# Patient Record
Sex: Female | Born: 1955 | Race: White | Hispanic: No | Marital: Married | Smoking: Never smoker
Health system: Southern US, Community
[De-identification: ages and names within clinical notes are randomized; demographics above are authoritative.]

## PROBLEM LIST (undated history)

## (undated) DIAGNOSIS — R51 Headache: Secondary | ICD-10-CM

## (undated) DIAGNOSIS — Z9010 Acquired absence of unspecified breast and nipple: Secondary | ICD-10-CM

## (undated) DIAGNOSIS — C50919 Malignant neoplasm of unspecified site of unspecified female breast: Secondary | ICD-10-CM

## (undated) DIAGNOSIS — F319 Bipolar disorder, unspecified: Secondary | ICD-10-CM

## (undated) DIAGNOSIS — Q782 Osteopetrosis: Secondary | ICD-10-CM

## (undated) DIAGNOSIS — E7800 Pure hypercholesterolemia, unspecified: Secondary | ICD-10-CM

## (undated) DIAGNOSIS — F419 Anxiety disorder, unspecified: Secondary | ICD-10-CM

## (undated) DIAGNOSIS — E079 Disorder of thyroid, unspecified: Secondary | ICD-10-CM

## (undated) DIAGNOSIS — G4730 Sleep apnea, unspecified: Secondary | ICD-10-CM

## (undated) DIAGNOSIS — T7412XA Child physical abuse, confirmed, initial encounter: Secondary | ICD-10-CM

## (undated) DIAGNOSIS — E119 Type 2 diabetes mellitus without complications: Secondary | ICD-10-CM

## (undated) DIAGNOSIS — K219 Gastro-esophageal reflux disease without esophagitis: Secondary | ICD-10-CM

## (undated) HISTORY — DX: Acquired absence of unspecified breast and nipple: Z90.10

## (undated) HISTORY — DX: Gastro-esophageal reflux disease without esophagitis: K21.9

## (undated) HISTORY — DX: Pure hypercholesterolemia, unspecified: E78.00

## (undated) HISTORY — DX: Disorder of thyroid, unspecified: E07.9

## (undated) HISTORY — DX: Headache: R51

## (undated) HISTORY — PX: BREAST SURGERY: SHX581

## (undated) HISTORY — DX: Osteopetrosis: Q78.2

## (undated) HISTORY — DX: Bipolar disorder, unspecified: F31.9

## (undated) HISTORY — DX: Sleep apnea, unspecified: G47.30

## (undated) HISTORY — DX: Anxiety disorder, unspecified: F41.9

## (undated) HISTORY — DX: Type 2 diabetes mellitus without complications: E11.9

## (undated) HISTORY — DX: Malignant neoplasm of unspecified site of unspecified female breast: C50.919

## (undated) HISTORY — DX: Child physical abuse, confirmed, initial encounter: T74.12XA

---

## 1997-09-29 ENCOUNTER — Emergency Department (HOSPITAL_COMMUNITY): Admission: EM | Admit: 1997-09-29 | Discharge: 1997-09-29 | Payer: Self-pay | Admitting: Emergency Medicine

## 2000-04-02 ENCOUNTER — Encounter (INDEPENDENT_AMBULATORY_CARE_PROVIDER_SITE_OTHER): Payer: Self-pay | Admitting: Specialist

## 2000-04-02 ENCOUNTER — Other Ambulatory Visit: Admission: RE | Admit: 2000-04-02 | Discharge: 2000-04-02 | Payer: Self-pay | Admitting: Internal Medicine

## 2000-09-30 ENCOUNTER — Other Ambulatory Visit: Admission: RE | Admit: 2000-09-30 | Discharge: 2000-09-30 | Payer: Self-pay | Admitting: *Deleted

## 2001-01-20 ENCOUNTER — Ambulatory Visit (HOSPITAL_COMMUNITY): Admission: RE | Admit: 2001-01-20 | Discharge: 2001-01-20 | Payer: Self-pay | Admitting: *Deleted

## 2001-07-28 ENCOUNTER — Emergency Department (HOSPITAL_COMMUNITY): Admission: EM | Admit: 2001-07-28 | Discharge: 2001-07-28 | Payer: Self-pay | Admitting: Emergency Medicine

## 2001-07-30 ENCOUNTER — Encounter (HOSPITAL_COMMUNITY): Admission: RE | Admit: 2001-07-30 | Discharge: 2001-10-28 | Payer: Self-pay | Admitting: Internal Medicine

## 2006-01-15 ENCOUNTER — Ambulatory Visit: Payer: Self-pay | Admitting: Internal Medicine

## 2006-02-18 ENCOUNTER — Encounter (INDEPENDENT_AMBULATORY_CARE_PROVIDER_SITE_OTHER): Payer: Self-pay | Admitting: *Deleted

## 2006-02-18 ENCOUNTER — Ambulatory Visit: Payer: Self-pay | Admitting: Internal Medicine

## 2006-07-14 ENCOUNTER — Ambulatory Visit: Payer: Self-pay | Admitting: Internal Medicine

## 2006-07-14 LAB — CONVERTED CEMR LAB
Albumin: 4.2 g/dL (ref 3.5–5.2)
Alkaline Phosphatase: 59 units/L (ref 39–117)
Ceruloplasmin: 24 mg/dL (ref 21–63)
Ferritin: 100.9 ng/mL (ref 10.0–291.0)
INR: 1 (ref 0.9–2.0)
Iron: 134 ug/dL (ref 42–145)
Prothrombin Time: 12 s (ref 10.0–14.0)
Total Bilirubin: 0.5 mg/dL (ref 0.3–1.2)

## 2006-07-15 ENCOUNTER — Ambulatory Visit: Payer: Self-pay | Admitting: Gastroenterology

## 2006-08-19 ENCOUNTER — Ambulatory Visit: Payer: Self-pay | Admitting: Internal Medicine

## 2008-11-15 IMAGING — US US ABDOMEN COMPLETE
1 series · 13 of 25 positions shown · non-contrast
Comparison: none

ACCESSION NUMBER:  [id number].

 ORDERING PHYSICIAN:  Dr. Kaki Jim.
 READING PHYSICIAN:  Dr. Jido Doaa.
 PROCEDURE:  Multiplanar abdominal ultrasound imaging was performed in the upright, supine, right and left lateral decubitus positions.
 RESULTS:  Abdominal aorta measures 1.8 cm in maximal diameter and appears normal.  The IVC is patent. 
 The pancreas appears normal throughout the head and body without evidence of ductal dilatation, pancreatic masses, or peripancreatic inflammation.  The tail of the pancreas is not adequately imaged. 
 The gallbladder wall thickness measures 2.1 mm. Gallbladder is well distended, thin walled, with no pericholecystic fluid or intraluminal echogenic foci to suggest gallstone disease. 
 The common bile duct measures 5.8 mm in maximal diameter without evidence of intraluminal foci. 
 In the liver, there is a severely increased echodensity with poor definitive of the liver border. The liver appears to be overall enlarged. The liver is without evidence of parenchymal lesion, ductal dilatation or vascular abnormality. 
 The right kidney measures 10.5 cm, the left kidney 10.8 cm. Both kidneys are normal in appearance. 
 Spleen is normal in size, measuring 11 cm without parenchymal lesion.

[Series 1: us abdomen complete · 0.11mm/px · 13 of 54 slices shown]
[im 1/54]
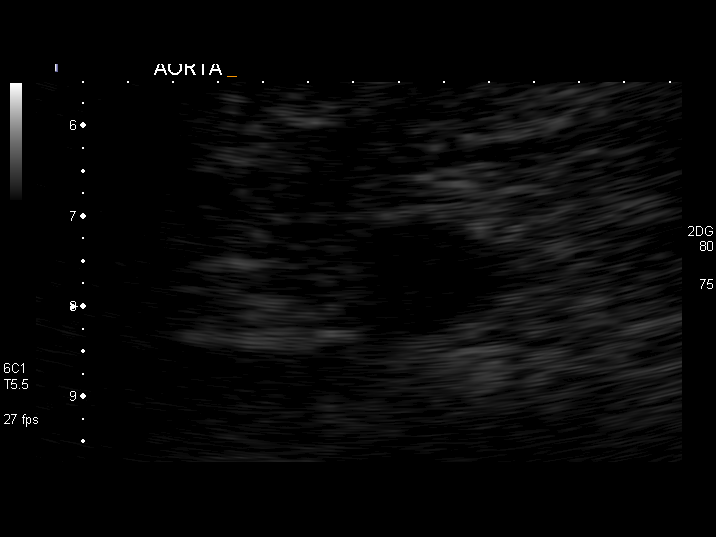
[im 5/54]
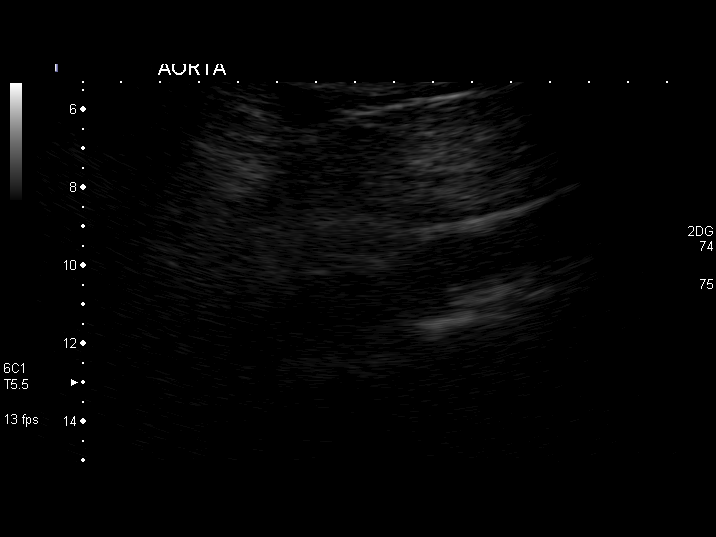
[im 9/54]
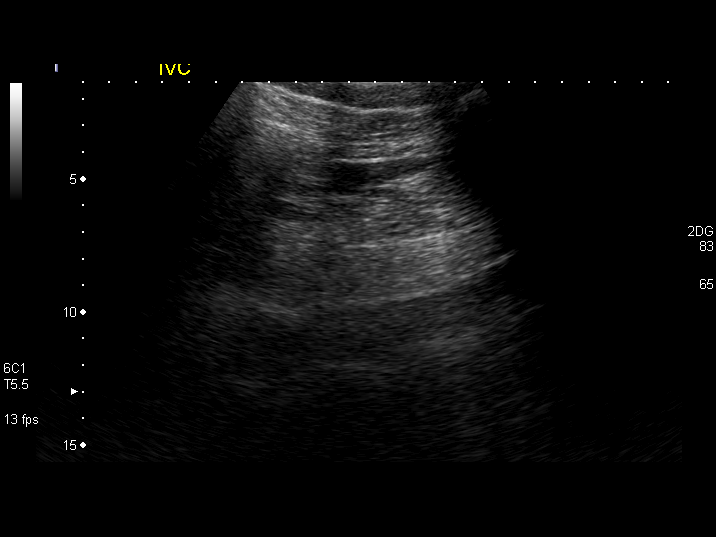
[im 14/54]
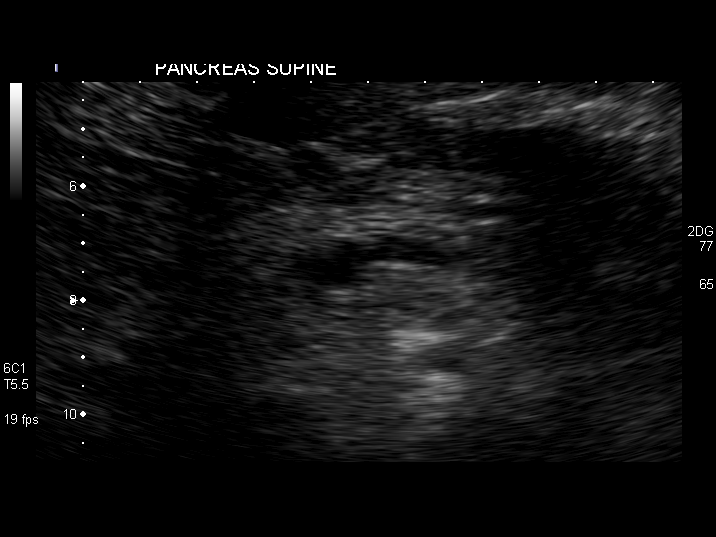
[im 18/54]
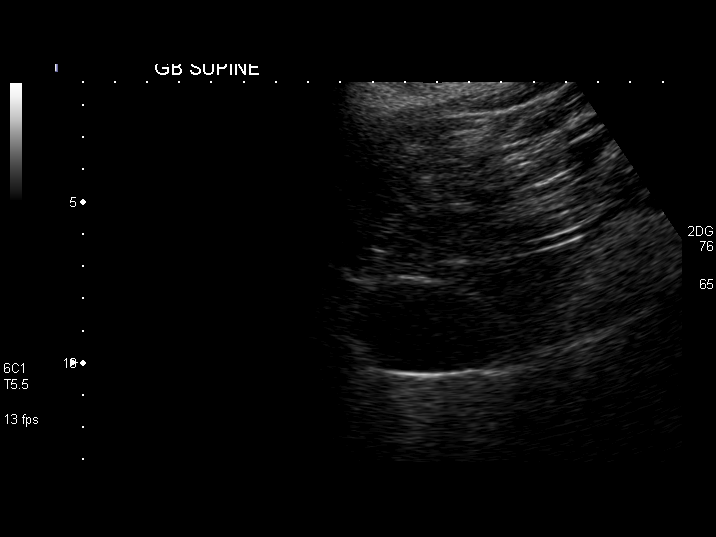
[im 23/54]
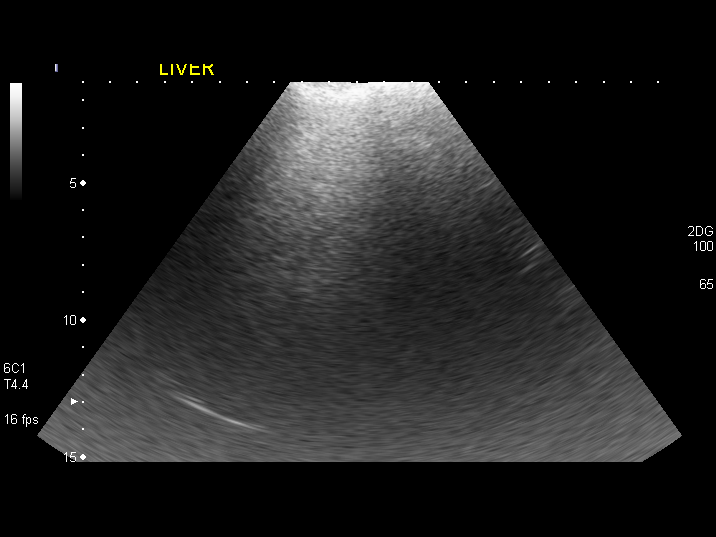
[im 27/54]
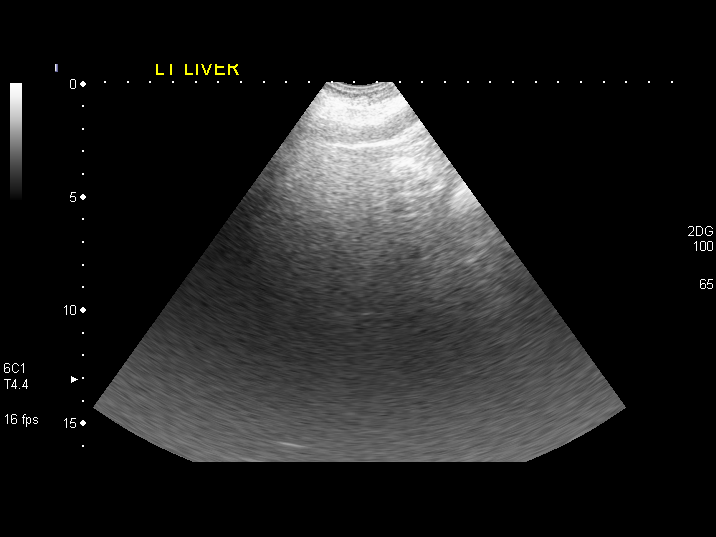
[im 31/54]
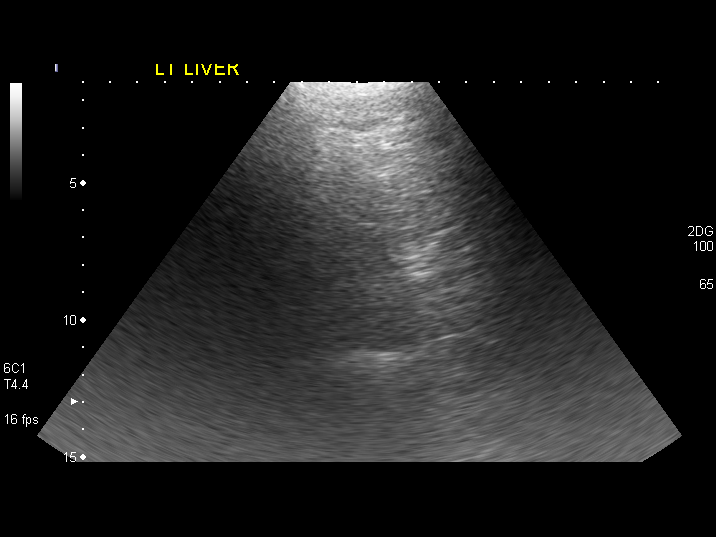
[im 36/54]
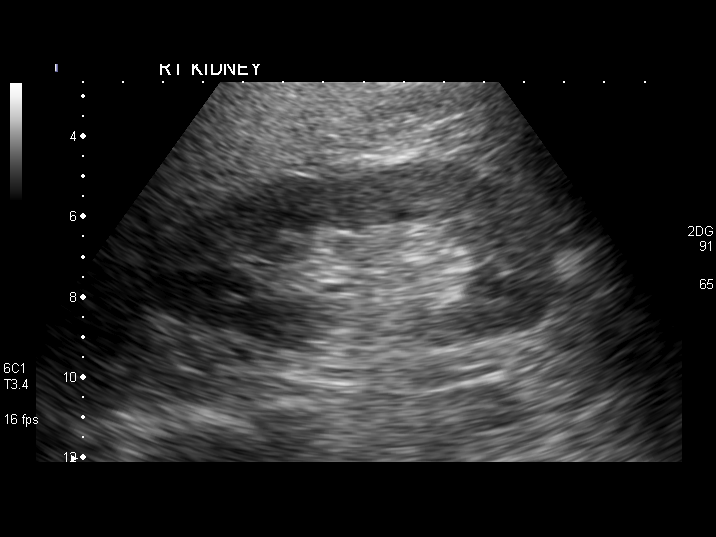
[im 40/54]
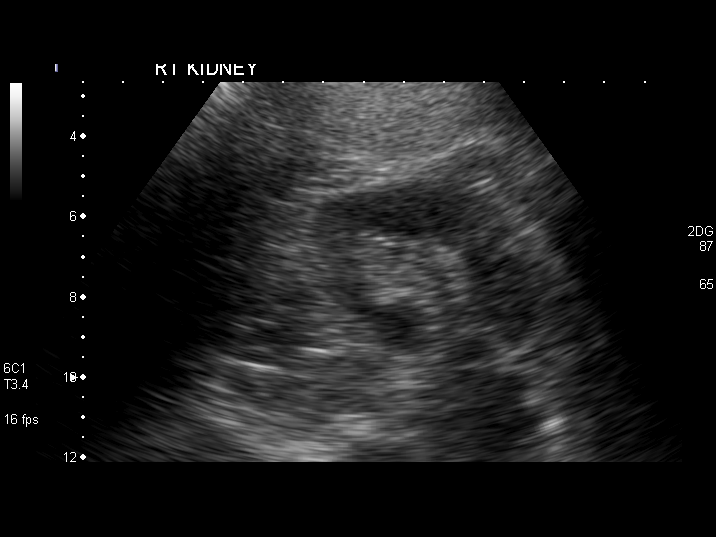
[im 45/54]
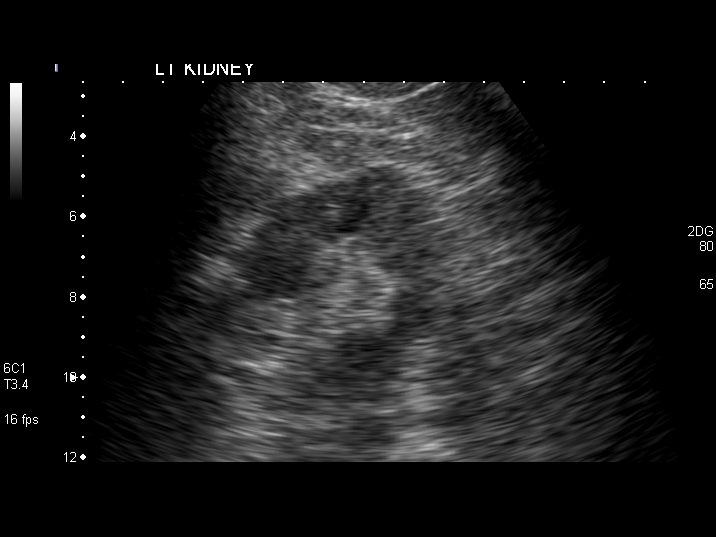
[im 49/54]
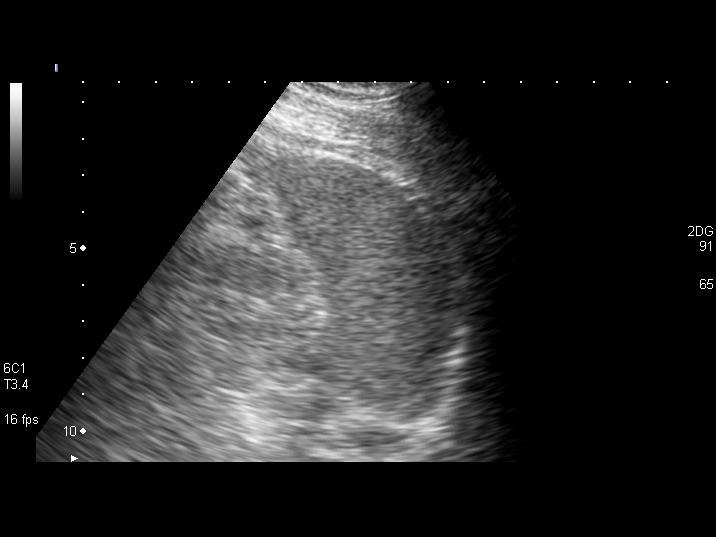
[im 54/54]
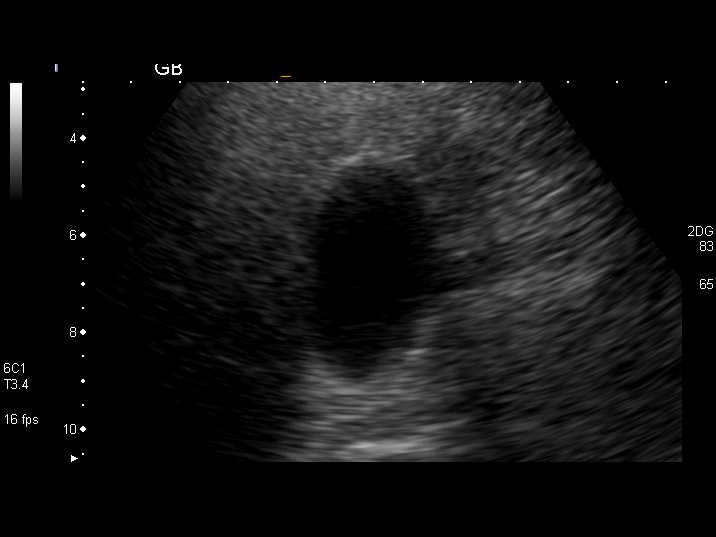

[13 of 25 positions shown; findings below may reference images not displayed]

IMPRESSION: 1.         Markedly increased hepatic echodensity with probable hepatomegaly and poor definition of the hepatic borders.
 2.         Pancreatic tail not imaged.

 <!--[if supportFields]>  IF "" <> "" " IF "TRUE" = "FALSE" "______________________________" "" 
 Xia, Gelacio
   IF "3Calcolm Locklear, Savio" = "3Calcolm Locklear, Savio""""" 

   IF "[id number] Dictated by DICTATOR 1 Xia, Gelacio [id number]  Jumper, Blain [id number]  Pointer, Niyi" = """______________________________
 " "" 
   IF "" = "Xia, Gelacio""
 " "______________________________
 " ______________________________

 " "" <!--[if supportFields]>

## 2010-04-09 ENCOUNTER — Ambulatory Visit (HOSPITAL_COMMUNITY): Payer: Self-pay | Admitting: Licensed Clinical Social Worker

## 2010-04-24 ENCOUNTER — Ambulatory Visit (HOSPITAL_COMMUNITY): Payer: Self-pay | Admitting: Psychiatry

## 2010-05-08 ENCOUNTER — Ambulatory Visit (HOSPITAL_COMMUNITY)
Admission: RE | Admit: 2010-05-08 | Discharge: 2010-05-08 | Payer: Self-pay | Source: Home / Self Care | Attending: Licensed Clinical Social Worker | Admitting: Licensed Clinical Social Worker

## 2010-05-15 ENCOUNTER — Ambulatory Visit (HOSPITAL_COMMUNITY): Admit: 2010-05-15 | Payer: Self-pay | Admitting: Licensed Clinical Social Worker

## 2010-05-28 ENCOUNTER — Ambulatory Visit (HOSPITAL_COMMUNITY): Admit: 2010-05-28 | Payer: Self-pay | Admitting: Psychiatry

## 2010-06-11 ENCOUNTER — Encounter (INDEPENDENT_AMBULATORY_CARE_PROVIDER_SITE_OTHER): Payer: BC Managed Care – PPO | Admitting: Psychiatry

## 2010-06-11 DIAGNOSIS — F319 Bipolar disorder, unspecified: Secondary | ICD-10-CM

## 2010-07-09 ENCOUNTER — Encounter (INDEPENDENT_AMBULATORY_CARE_PROVIDER_SITE_OTHER): Payer: Medicare Other | Admitting: Psychiatry

## 2010-07-09 DIAGNOSIS — F319 Bipolar disorder, unspecified: Secondary | ICD-10-CM

## 2010-07-11 ENCOUNTER — Encounter (HOSPITAL_COMMUNITY): Payer: BC Managed Care – PPO | Admitting: Licensed Clinical Social Worker

## 2010-09-12 ENCOUNTER — Encounter (HOSPITAL_COMMUNITY): Payer: BC Managed Care – PPO | Admitting: Psychiatry

## 2010-09-13 NOTE — Assessment & Plan Note (Signed)
[facility]                         GASTROENTEROLOGY OFFICE NOTE   TULIP, MEHARG                MRN:          [id number]  DATE:07/14/2006                            DOB:          November 14, 1955    REFERRING PHYSICIAN:  Pricilla Holm, M.D.   REASON FOR CONSULTATION:  Elevated liver function tests.   HISTORY:  This is a 55 year old white female with bipolar disorder,  asthma, hypothyroidism, dyslipidemia, gastroesophageal reflux disease,  and colon polyps who is referred through the courteously of Dr. Manson Passey  regarding elevated liver tests.  The patient had liver tests performed  in February 2008.  Her SGOT and SGPT were a few points above normal.  Alkaline phosphatase, bilirubin, albumin and protein were normal.  Serologies for hepatitis A, B and C were negative.  She is now referred.  The patient is not certain if she has had liver test abnormalities in  the past.  She has been on Depakote long term.  She has had a 35 pound  weight gain over the past 6 years.  She denies alcohol use.  She denies  a personal history of hepatitis.  She denies a family history of liver  disease.  She denies a history of jaundice, ascites, ankle edema or  other relevant complaints.  In terms of her reflux disease, she is  maintained on Protonix and doing well.  Upper endoscopy in 2001 revealed  no abnormalities of the esophagus.  Her most recent colonoscopy in  October 2007 revealed several diminutive colon polyps, which were  removed and found to be adenomatous.  Also a smaller hyperplastic  appearing polyp (which she has had in the past) which was not removed.  Reviewed with the patient today.   PAST MEDICAL HISTORY:  As above.   PAST SURGICAL HISTORY:  1. Hemorrhoidectomy.  2. Breast surgery for breast cancer.  3. TRAM procedure.  4. Fractured right ankle.   ALLERGIES:  No known drug allergies.   CURRENT MEDICATIONS:  1. Actonel 35 mg weekly.  2. Aromasin 25 mg daily.  3. Synthroid 88 mcg daily.  4. Gemfibrozil 600 mg daily.  5. Zyrtec 10 mg at night.  6. Advair disk.  7. Singulair.  8. Protonix 40 mg at night.  9. Depakote ER 1000 mg at night.  10.Multivitamin.  11.Calcium.  12.Fish oil.  13.Vitamin E.  14.Acidophilus.  15.Baby aspirin.  16.Albuterol inhaler p.r.n.  17.Preparation H p.r.n.  18.Hemorrhoid wipes p.r.n.   FAMILY HISTORY:  No family history of liver disease.   SOCIAL HISTORY:  The patient is married with 2 daughter, lives with her  husband.  She used to work for [employer], but is currently disabled.  She does  not smoke or use alcohol.   REVIEW OF SYSTEMS:  Per diagnostic evaluation form.   PHYSICAL EXAMINATION:  A well appearing female in no acute distress.  Blood pressure is 118/74, heart rate 68, weight 174.2 pounds.  She is 5  feet 5 inches in height.  HEENT:  Sclerae anicteric.  Conjunctivae are pink.  Oral mucosa is  intact.  There is no adenopathy.  The tongue hue is normal.  LUNGS:  Clear.  HEART:  Regular.  ABDOMEN:  Obese and soft without tenderness, mass or hernia.  No  organomegaly.  Good bowel sounds heard.  No prominent venous pattern on  the abdominal wall.  EXTREMITIES:  Without edema.  NEUROLOGICALLY:  She is intact.  No asterixis.   IMPRESSION:  80. A 55 year old who presents for evaluation of mildly elevated      hepatic transaminases.  Duration unknown.  Statistically, this is      most likely fatty liver disease.  Cannot rule out medication      reaction or other metabolic condition.  No evidence for chronic      viral hepatitis or alcohol induced changes.  2. Gastroesophageal reflux disease.  Asymptomatic on Protonix.  3. History of colon polyps.  Due for a follow up in October 2012.   RECOMMENDATIONS:  1. Expand laboratory profile to evaluate for chronic non-viral causes      for elevated liver function tests.  2. Ultrasound of the liver.  3. Office follow up in 1 month to  review the above.  Otherwise, resume      general medical care with Dr. Manson Passey.    Wilhemina Bonito. Marina Goodell, MD  Electronically Signed   JNP/MedQ  DD: 07/15/2006  DT: 07/16/2006  Job #: [id number]   cc:   Pricilla Holm, M.D.

## 2010-09-13 NOTE — Procedures (Signed)
[facility]  Patient:    Natalie Frost, Natalie Frost Visit Number: [id number] MRN: [id number]          Service Type: END Location: ENDO Attending Physician:  Sabino Gasser Proc. Date: 01/20/01 Admit Date:  01/20/2001                             Procedure Report  PROCEDURE:  Colonoscopy.  INDICATIONS:  Rectal bleeding.  ANESTHESIA:  Demerol 25, Versed 2 additional mg.  DESCRIPTION OF PROCEDURE:  With the patient mildly sedated in the left lateral decubitus position, the Olympus videoscopic colonoscope was inserted in the rectum and passed under direct vision to the cecum, identified by the ileocecal valve and appendiceal orifice, both of which were photographed. From this point, the colonoscope was slowly withdrawn taking circumferential views of the entire colonic mucosa, stopping only in the rectum which appeared normal on direct and  retroflexed view.  The endoscope was then straightened and withdrawn.  The patients vital signs and pulse oximeter remained stable. The patient tolerated the procedure well without apparent complications.  FINDINGS:  Essentially negative colonoscopic examination.  PLAN:  Have patient follow up with me as an outpatient. Attending Physician:  Sabino Gasser DD:  01/20/01 TD:  01/20/01 Job: [id number]

## 2010-09-13 NOTE — Procedures (Signed)
[facility]  Patient:    SEJLA, MARZANO Visit Number: [id number] MRN: [id number]          Service Type: END Location: ENDO Attending Physician:  Sabino Gasser Dictated by:   Sabino Gasser, M.D. Admit Date:  01/20/2001                             Procedure Report  PROCEDURE:  Upper endoscopy.  INDICATIONS:  GERD.  ANESTHESIA:  Demerol 50 mg, Versed 5 mg.  DESCRIPTION OF PROCEDURE:  With the patient mildly sedated, in the left lateral decubitus position, the Olympus videoscopic endoscope was inserted into the mouth, passed under direct vision through the esophagus which appeared normal into the stomach.  Fundus, body, antrum, duodenal bulb, second portion of the duodenum were all well visualized and appeared normal.  From this point the endoscope was slowly withdrawn taking circumferential views of the entire duodenal mucosa until the endoscope was pulled back into the stomach and placed in retroflexion to view the stomach from below.  The endoscope was then straightened and withdrawn, taking circumferential views of the remaining gastric and esophageal mucosa which otherwise appeared normal. The patients vital signs and pulse oximetry remained stable.  The patient tolerated the procedure well without apparent complications.  FINDINGS:  Essentially normal examination.  PLAN:  Proceed to colonoscopy as planned. Dictated by:   Sabino Gasser, M.D. Attending Physician:  Sabino Gasser DD:  01/20/01 TD:  01/20/01 Job: [id number]

## 2010-09-13 NOTE — Assessment & Plan Note (Signed)
[facility]                         GASTROENTEROLOGY OFFICE NOTE   Natalie Frost, Natalie Frost                MRN:          [id number]  DATE:08/19/2006                            DOB:          08-22-1955    HISTORY:  The patient presents today for followup of her mildly elevated  hepatic transaminases.  She is a 55 year old with bipolar disorder,  asthma, hypothyroidism, dyslipidemia, gastroesophageal reflux disease  and colon polyps, who was evaluated on July 14, 2006, regarding  elevated transaminases.  See that dictation for details.  The most  significant aspect of her history is that she has gained 35 pounds over  the past six years.  Her husband states it has really been over the past  2-1/2 years.  In any event, viral studies were negative.  She does not  use alcohol.   Other studies including alpha-1 antitrypsin ceruloplasmin,  antimitochondrial antibody, antinuclear antibody, iron studies,  prothrombin time, proteins and globulin level were all normal.  She did  have a weakly positive smooth muscle antibody at 27.   An abdominal ultrasound revealed markedly increased hepatic endo-density  of the liver, without focal lesions.  Her liver was borderline enlarged.   She comes in today to review these findings.  It is difficult to say  without documentation, but it appears that after being placed on the  Depakote initially, there were no problems with her liver tests for  years, but rather issues more recently.  Her dosage of the drug  apparently has been stable.   PHYSICAL EXAMINATION:  GENERAL:  A well-appearing female, in no acute  distress.  VITAL SIGNS:  Blood pressure 104/74, heart rate 84, weight 171.2 pounds.  ABDOMEN:  Soft, without tenderness, mass or hernia.  There is no  palpable liver or spleen.   IMPRESSION:  Mildly elevated hepatic transaminases, most likely due to  fatty liver disease.  Cannot differentiate between benign  fatty liver  disease and non-alcoholic steatohepatitis.  Certainly could have drug  reaction related to Depakote, though this seems less likely, based on  the history.   One way to differentiate might be a liver biopsy, though this is  invasive with inherent risks.  Alternatively, she could have her drug  held for six months and her liver tests monitored.  Finally, and what I  recommended, is that she lose 10-15 pounds over the next six months and  follow up in this office for repeat liver tests and evaluation at that  time.  If questions remain or clinical  concerns develop, then she may indeed require a liver biopsy.  In the  interim, she will return to the care of Dr. Adan Sis and Dr.  Nolen Mu.     Wilhemina Bonito. Marina Goodell, MD  Electronically Signed    JNP/MedQ  DD: 08/19/2006  DT: 08/19/2006  Job #: [id number]   cc:   Pricilla Holm, M.D.  Andee Poles, M.D.

## 2010-10-15 ENCOUNTER — Encounter (INDEPENDENT_AMBULATORY_CARE_PROVIDER_SITE_OTHER): Payer: BC Managed Care – PPO | Admitting: Psychiatry

## 2010-10-15 DIAGNOSIS — F319 Bipolar disorder, unspecified: Secondary | ICD-10-CM

## 2010-10-15 DIAGNOSIS — F411 Generalized anxiety disorder: Secondary | ICD-10-CM

## 2010-10-17 ENCOUNTER — Ambulatory Visit
Admission: RE | Admit: 2010-10-17 | Discharge: 2010-10-17 | Disposition: A | Discharge status: Home / Self Care | Payer: BC Managed Care – PPO | Source: Ambulatory Visit | Attending: Orthopedic Surgery | Admitting: Orthopedic Surgery

## 2010-10-17 ENCOUNTER — Other Ambulatory Visit: Payer: Self-pay | Admitting: Orthopedic Surgery

## 2010-10-17 DIAGNOSIS — M79642 Pain in left hand: Secondary | ICD-10-CM

## 2010-10-17 DIAGNOSIS — M7989 Other specified soft tissue disorders: Secondary | ICD-10-CM

## 2010-10-29 ENCOUNTER — Other Ambulatory Visit: Payer: Self-pay | Admitting: Orthopedic Surgery

## 2010-10-29 DIAGNOSIS — T148XXA Other injury of unspecified body region, initial encounter: Secondary | ICD-10-CM

## 2010-10-31 ENCOUNTER — Other Ambulatory Visit: Payer: Self-pay | Admitting: Orthopedic Surgery

## 2010-10-31 ENCOUNTER — Ambulatory Visit
Admission: RE | Admit: 2010-10-31 | Discharge: 2010-10-31 | Disposition: A | Discharge status: Home / Self Care | Payer: BC Managed Care – PPO | Source: Ambulatory Visit | Attending: Orthopedic Surgery | Admitting: Orthopedic Surgery

## 2010-10-31 DIAGNOSIS — T148XXA Other injury of unspecified body region, initial encounter: Secondary | ICD-10-CM

## 2010-11-28 ENCOUNTER — Encounter (INDEPENDENT_AMBULATORY_CARE_PROVIDER_SITE_OTHER): Payer: BC Managed Care – PPO | Admitting: Psychiatry

## 2011-02-04 ENCOUNTER — Encounter (HOSPITAL_COMMUNITY): Payer: Self-pay | Admitting: Psychology

## 2011-02-18 ENCOUNTER — Encounter: Payer: Self-pay | Admitting: Internal Medicine

## 2011-03-04 ENCOUNTER — Encounter (HOSPITAL_COMMUNITY): Payer: Medicare Other | Admitting: Psychiatry

## 2011-03-25 ENCOUNTER — Encounter (HOSPITAL_COMMUNITY): Payer: Self-pay | Admitting: Psychiatry

## 2011-03-27 ENCOUNTER — Ambulatory Visit (INDEPENDENT_AMBULATORY_CARE_PROVIDER_SITE_OTHER): Payer: Medicare Other | Admitting: Psychiatry

## 2011-03-27 ENCOUNTER — Encounter (HOSPITAL_COMMUNITY): Payer: Self-pay | Admitting: Psychiatry

## 2011-03-27 VITALS — BP 122/82 | Ht 65.0 in | Wt 161.0 lb

## 2011-03-27 DIAGNOSIS — F411 Generalized anxiety disorder: Secondary | ICD-10-CM

## 2011-03-27 DIAGNOSIS — F319 Bipolar disorder, unspecified: Secondary | ICD-10-CM

## 2011-03-27 NOTE — Progress Notes (Signed)
   [facility] Follow-up Outpatient Visit  Natalie Frost 04-21-1956   Subjective: Patient is a 55 year old female who has been followed by [facility] since November of 2011. The patient is currently diagnosed with bipolar affective disorder along with generalized anxiety disorder. She's been doing very well on Lamictal and Zoloft. At her last appointment which was in August patient was complaining about trouble with sleep. At that time I started her on trazodone. On seeing her back today for the first time. Patient is only been taking one half of a 50 mg trazodone. She states that it helps a little bit but it still takes a long time to fall asleep. The patient has lost 17 pounds since initiating treatment here. A lot of it has to do with the fact of getting her off the Depakote and onto the Lamictal. She states that her physician wants her to get down to 140 pounds. The patient has been much more outgoing. She has joined a new church. She recently hosted a birthday party for her mother-in-law 4/85 birthday. There're approximately 60 people who attended this. I reminded her about last Christmas and how she was overwhelmed with hosting 18 people. The patient states that sometimes she still will get aggravated with her husband or her 2 grandchildren. However this appears to be appropriate behavior. The patient denies any mood swings.  Filed Vitals:   03/27/11 1331  BP: 122/82    Mental Status Examination  Appearance: Casually dressed Alert: Yes Attention: good  Cooperative: Yes Eye Contact: Good Speech: Regular rate rhythm and volume Psychomotor Activity: Normal Memory/Concentration: Intact Oriented: person, place, time/date and situation Mood: Euthymic Affect: Full Range Thought Processes and Associations: Logical Fund of Knowledge: Fair Thought Content: No suicidal or homicidal Insight: Fair Judgement: Fair  Diagnosis: Bipolar disorder, generalized  anxiety disorder  Treatment Plan: Patient is advised that she could take a little bit more trazodone if she needs to. A prescription says that she can take 1-250 mg tablets at bedtime. She is currently only taking one half. We will continue her Lamictal and Zoloft. I will see her back in 3 months.  Jamse Mead, MD

## 2011-05-17 ENCOUNTER — Other Ambulatory Visit (HOSPITAL_COMMUNITY): Payer: Self-pay | Admitting: Psychiatry

## 2011-05-17 DIAGNOSIS — F319 Bipolar disorder, unspecified: Secondary | ICD-10-CM

## 2011-05-19 ENCOUNTER — Other Ambulatory Visit (HOSPITAL_COMMUNITY): Payer: Self-pay | Admitting: Psychiatry

## 2011-06-26 ENCOUNTER — Ambulatory Visit (HOSPITAL_COMMUNITY): Payer: Medicare Other | Admitting: Psychiatry

## 2011-07-18 ENCOUNTER — Encounter (HOSPITAL_COMMUNITY): Payer: Self-pay | Admitting: Psychiatry

## 2011-07-18 ENCOUNTER — Ambulatory Visit (INDEPENDENT_AMBULATORY_CARE_PROVIDER_SITE_OTHER): Payer: Medicare Other | Admitting: Psychiatry

## 2011-07-18 VITALS — BP 142/85 | Ht 65.0 in | Wt 157.0 lb

## 2011-07-18 DIAGNOSIS — F319 Bipolar disorder, unspecified: Secondary | ICD-10-CM

## 2011-07-18 DIAGNOSIS — F314 Bipolar disorder, current episode depressed, severe, without psychotic features: Secondary | ICD-10-CM | POA: Insufficient documentation

## 2011-07-18 DIAGNOSIS — F411 Generalized anxiety disorder: Secondary | ICD-10-CM | POA: Insufficient documentation

## 2011-07-18 NOTE — Progress Notes (Signed)
   [facility] Behavioral Health Follow-up Outpatient Visit  Natalie Frost Jul 25, 1955   Subjective: Patient is a 56 year old female who has been followed by [facility] since November of 2011. The patient is currently diagnosed with bipolar affective disorder along with generalized anxiety disorder. She's been doing very well on Lamictal and Zoloft. She also takes trazodone for insomnia. The patient reports today that she's been doing well. She is down another 4 pounds. She is very proud of her weight loss. She does use a CPAP machine to sleep, and says that can interfere with her sleep at times. She complains about some weird dreams. The patient had the flu recently. She states that this caused a drop in blood sugar and she did pass out at one point. Her husband revived her with grape juice. Her primary care physician put her on albuterol nebs to help with breathing. The patient endorses stable mood. She did have a steroid dose pack recently, which concerns me of the possibility fueling her into mania. She is a little giddy today, but nothing inappropriate.  Filed Vitals:   07/18/11 1038  BP: 142/85    Mental Status Examination  Appearance: Casually dressed Alert: Yes Attention: good  Cooperative: Yes Eye Contact: Good Speech: Regular rate rhythm and volume Psychomotor Activity: Normal Memory/Concentration: Intact Oriented: person, place, time/date and situation Mood: Euthymic Affect: Full Range Thought Processes and Associations: Logical Fund of Knowledge: Fair Thought Content: No suicidal or homicidal Insight: Fair Judgement: Fair  Diagnosis: Bipolar disorder, generalized anxiety disorder  Treatment Plan: We will not make any changes today. I will see her back in 3 months.  Jamse Mead, MD

## 2011-10-23 ENCOUNTER — Encounter (HOSPITAL_COMMUNITY): Payer: Self-pay | Admitting: Psychiatry

## 2011-10-23 ENCOUNTER — Ambulatory Visit (INDEPENDENT_AMBULATORY_CARE_PROVIDER_SITE_OTHER): Payer: Medicare Other | Admitting: Psychiatry

## 2011-10-23 VITALS — BP 132/82 | Ht 65.0 in | Wt 161.0 lb

## 2011-10-23 DIAGNOSIS — F411 Generalized anxiety disorder: Secondary | ICD-10-CM

## 2011-10-23 DIAGNOSIS — F319 Bipolar disorder, unspecified: Secondary | ICD-10-CM

## 2011-10-23 MED ORDER — TRAZODONE HCL 50 MG PO TABS: 100.0000 mg | ORAL_TABLET | Freq: Every day | ORAL | Status: DC

## 2011-10-23 MED ORDER — LAMOTRIGINE 100 MG PO TABS: 100.0000 mg | ORAL_TABLET | Freq: Every day | ORAL | Status: DC

## 2011-10-23 NOTE — Progress Notes (Signed)
   [facility] Behavioral Health Follow-up Outpatient Visit  Chrishelle Milissa Fesperman 06-13-1955   Subjective: Patient is a 56 year old female who has been followed by [facility] since November of 2011. The patient is currently diagnosed with bipolar affective disorder along with generalized anxiety disorder. She's been doing very well on Lamictal and Zoloft. She also takes trazodone for insomnia. I did not make any changes at her last appointment. Today she presents in a good mood. She is up 4 pounds today. She just returned from a weeklong trip to [facility]. Her husband is remodeling some in her house. She shows me pictures, and it is a beautiful estate. She has an outdoor pool. Her 4 grandchildren visit a lot. The patient endorses good mood. She has no issues with appetite. She is having a trouble with nightmares at night. She's taking the trazodone 50 mg and it does help with sleep, but the nightmares continue. She is concerned about this. She denies any manic episodes. She feels that her anxiety is under control.  Filed Vitals:   10/23/11 1043  BP: 132/82    Mental Status Examination  Appearance: Casually dressed Alert: Yes Attention: good  Cooperative: Yes Eye Contact: Good Speech: Regular rate rhythm and volume Psychomotor Activity: Normal Memory/Concentration: Intact Oriented: person, place, time/date and situation Mood: Euthymic Affect: Full Range Thought Processes and Associations: Logical Fund of Knowledge: Fair Thought Content: No suicidal or homicidal Insight: Fair Judgement: Fair  Diagnosis: Bipolar disorder, generalized anxiety disorder  Treatment Plan: I will increase trazodone to 100 mg at bedtime. I will continue the Lamictal, and Zoloft. I will see the patient back in 2 months.   Jamse Mead, MD

## 2011-12-03 ENCOUNTER — Other Ambulatory Visit (HOSPITAL_COMMUNITY): Payer: Self-pay | Admitting: Psychiatry

## 2011-12-25 ENCOUNTER — Ambulatory Visit (INDEPENDENT_AMBULATORY_CARE_PROVIDER_SITE_OTHER): Payer: Medicare Other | Admitting: Psychiatry

## 2011-12-25 ENCOUNTER — Encounter (HOSPITAL_COMMUNITY): Payer: Self-pay | Admitting: Psychiatry

## 2011-12-25 VITALS — BP 122/82 | Ht 65.0 in | Wt 163.0 lb

## 2011-12-25 DIAGNOSIS — F319 Bipolar disorder, unspecified: Secondary | ICD-10-CM

## 2011-12-25 DIAGNOSIS — F411 Generalized anxiety disorder: Secondary | ICD-10-CM

## 2011-12-25 NOTE — Progress Notes (Signed)
   [facility]al [facility] Follow-up Outpatient Visit  Natalie Frost 1955-09-02   Subjective: Patient is a 56 year old female who has been followed by [facility] since November of 2011. The patient is currently diagnosed with bipolar affective disorder along with generalized anxiety disorder. She's been doing very well on Lamictal and Zoloft. She also takes trazodone for insomnia. At her last appointment, I increased her trazodone to 100 mg at bedtime because of frequent nightmares. She presents today. She endorses occasional poor sleep still. She does wear CPAP at night. She is a diabetic and must proper feet up. She also has a sore hip so it's hard to turn. This environment makes it difficult to sleep well. She is concerned about her daughter. Her daughter's husband has been seeing other women. The patient is afraid of how it will impact her grandchildren when their father leaves. She feels as though she is doing well and knows that she needs be supportive for the grandchildren. She and her husband are doing well. She endorses good sleep and appetite. She is up 2 pounds today, but still maintains her healthy weight loss over the past few years. She is pleased with how well her medication is working.  Filed Vitals:   12/25/11 1101  BP: 122/82    Mental Status Examination  Appearance: Casually dressed Alert: Yes Attention: good  Cooperative: Yes Eye Contact: Good Speech: Regular rate rhythm and volume Psychomotor Activity: Normal Memory/Concentration: Intact Oriented: person, place, time/date and situation Mood: Euthymic Affect: Full Range Thought Processes and Associations: Logical Fund of Knowledge: Fair Thought Content: No suicidal or homicidal Insight: Fair Judgement: Fair  Diagnosis: Bipolar disorder, generalized anxiety disorder  Treatment Plan: I will not make any changes today. I will continue the Lamictal, Zoloft, and trazodone. I will see the  patient back in 3 months.   Jamse Mead, MD

## 2011-12-29 ENCOUNTER — Other Ambulatory Visit (HOSPITAL_COMMUNITY): Payer: Self-pay | Admitting: Psychiatry

## 2012-01-23 ENCOUNTER — Encounter: Payer: Self-pay | Admitting: Internal Medicine

## 2012-01-27 ENCOUNTER — Other Ambulatory Visit (HOSPITAL_COMMUNITY): Payer: Self-pay | Admitting: Psychiatry

## 2012-03-30 ENCOUNTER — Ambulatory Visit (HOSPITAL_COMMUNITY): Payer: Self-pay | Admitting: Psychiatry

## 2012-03-31 ENCOUNTER — Other Ambulatory Visit (HOSPITAL_COMMUNITY): Payer: Self-pay | Admitting: Psychiatry

## 2012-04-01 NOTE — Telephone Encounter (Signed)
Needs appointment

## 2012-04-09 ENCOUNTER — Other Ambulatory Visit (HOSPITAL_COMMUNITY): Payer: Self-pay | Admitting: Psychiatry

## 2012-05-13 ENCOUNTER — Ambulatory Visit (INDEPENDENT_AMBULATORY_CARE_PROVIDER_SITE_OTHER): Payer: BC Managed Care – PPO | Admitting: Psychiatry

## 2012-05-13 ENCOUNTER — Encounter (HOSPITAL_COMMUNITY): Payer: Self-pay | Admitting: Psychiatry

## 2012-05-13 VITALS — BP 120/78 | Ht 65.0 in | Wt 164.0 lb

## 2012-05-13 DIAGNOSIS — F411 Generalized anxiety disorder: Secondary | ICD-10-CM

## 2012-05-13 DIAGNOSIS — F319 Bipolar disorder, unspecified: Secondary | ICD-10-CM
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
## 2012-05-20 ENCOUNTER — Other Ambulatory Visit (HOSPITAL_COMMUNITY): Payer: Self-pay | Admitting: Psychiatry

## 2012-06-29 ENCOUNTER — Other Ambulatory Visit (HOSPITAL_COMMUNITY): Payer: Self-pay | Admitting: Psychiatry

## 2012-08-03 ENCOUNTER — Other Ambulatory Visit (HOSPITAL_COMMUNITY): Payer: Self-pay | Admitting: Psychiatry

## 2012-08-03 MED ORDER — SERTRALINE HCL 25 MG PO TABS: ORAL_TABLET | ORAL | Status: DC

## 2012-08-11 ENCOUNTER — Ambulatory Visit (HOSPITAL_COMMUNITY): Payer: Self-pay | Admitting: Psychiatry

## 2012-09-15 ENCOUNTER — Ambulatory Visit (INDEPENDENT_AMBULATORY_CARE_PROVIDER_SITE_OTHER): Payer: BC Managed Care – PPO | Admitting: Psychiatry

## 2012-09-15 ENCOUNTER — Encounter (HOSPITAL_COMMUNITY): Payer: Self-pay | Admitting: Psychiatry

## 2012-09-15 VITALS — BP 144/90 | Ht 65.0 in | Wt 166.0 lb

## 2012-09-15 DIAGNOSIS — F411 Generalized anxiety disorder: Secondary | ICD-10-CM

## 2012-09-15 DIAGNOSIS — F319 Bipolar disorder, unspecified: Secondary | ICD-10-CM

## 2012-09-15 MED ORDER — LAMOTRIGINE 100 MG PO TABS: ORAL_TABLET | ORAL | Status: DC
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
## 2012-10-12 ENCOUNTER — Telehealth (HOSPITAL_COMMUNITY): Payer: Self-pay | Admitting: Psychiatry

## 2012-10-12 MED ORDER — TRAZODONE HCL 50 MG PO TABS: 100.0000 mg | ORAL_TABLET | Freq: Every day | ORAL | Status: DC

## 2012-10-12 NOTE — Telephone Encounter (Signed)
Called patient. Left message informing patient of refill of trazodone and her appointment on 10/21/2012.

## 2012-10-21 ENCOUNTER — Encounter (HOSPITAL_COMMUNITY): Payer: Self-pay | Admitting: Psychiatry

## 2012-10-21 ENCOUNTER — Ambulatory Visit (INDEPENDENT_AMBULATORY_CARE_PROVIDER_SITE_OTHER): Payer: BC Managed Care – PPO | Admitting: Psychiatry

## 2012-10-21 VITALS — BP 126/82 | Ht 65.0 in | Wt 168.0 lb

## 2012-10-21 DIAGNOSIS — F319 Bipolar disorder, unspecified: Secondary | ICD-10-CM

## 2012-10-21 DIAGNOSIS — F411 Generalized anxiety disorder: Secondary | ICD-10-CM
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
## 2012-11-10 ENCOUNTER — Other Ambulatory Visit (HOSPITAL_COMMUNITY): Payer: Self-pay | Admitting: Psychiatry

## 2012-11-10 ENCOUNTER — Other Ambulatory Visit (HOSPITAL_COMMUNITY): Payer: Self-pay | Admitting: *Deleted

## 2012-11-10 DIAGNOSIS — F411 Generalized anxiety disorder: Secondary | ICD-10-CM

## 2012-11-10 MED ORDER — SERTRALINE HCL 25 MG PO TABS: ORAL_TABLET | ORAL | Status: DC

## 2013-01-17 ENCOUNTER — Other Ambulatory Visit (HOSPITAL_COMMUNITY): Payer: Self-pay | Admitting: Psychiatry

## 2013-01-19 ENCOUNTER — Ambulatory Visit (HOSPITAL_COMMUNITY): Payer: Self-pay | Admitting: Psychiatry

## 2013-01-20 ENCOUNTER — Ambulatory Visit (INDEPENDENT_AMBULATORY_CARE_PROVIDER_SITE_OTHER): Payer: BC Managed Care – PPO | Admitting: Psychiatry

## 2013-01-20 ENCOUNTER — Encounter (HOSPITAL_COMMUNITY): Payer: Self-pay | Admitting: Psychiatry

## 2013-01-20 VITALS — BP 124/84 | Ht 65.0 in | Wt 164.0 lb

## 2013-01-20 DIAGNOSIS — F319 Bipolar disorder, unspecified: Secondary | ICD-10-CM

## 2013-01-20 DIAGNOSIS — F411 Generalized anxiety disorder: Secondary | ICD-10-CM
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
## 2013-02-17 ENCOUNTER — Other Ambulatory Visit (HOSPITAL_COMMUNITY): Payer: Self-pay | Admitting: Psychiatry

## 2013-02-17 DIAGNOSIS — F411 Generalized anxiety disorder: Secondary | ICD-10-CM

## 2013-02-17 IMAGING — CR DG HAND 2V*L*
1 series · 1 of 1 positions shown · non-contrast
Comparison: None.

CLINICAL DATA: Fell 1 day ago with pain in the fourth and fifth
fingers

LEFT HAND - 2 VIEW

[view not recorded]
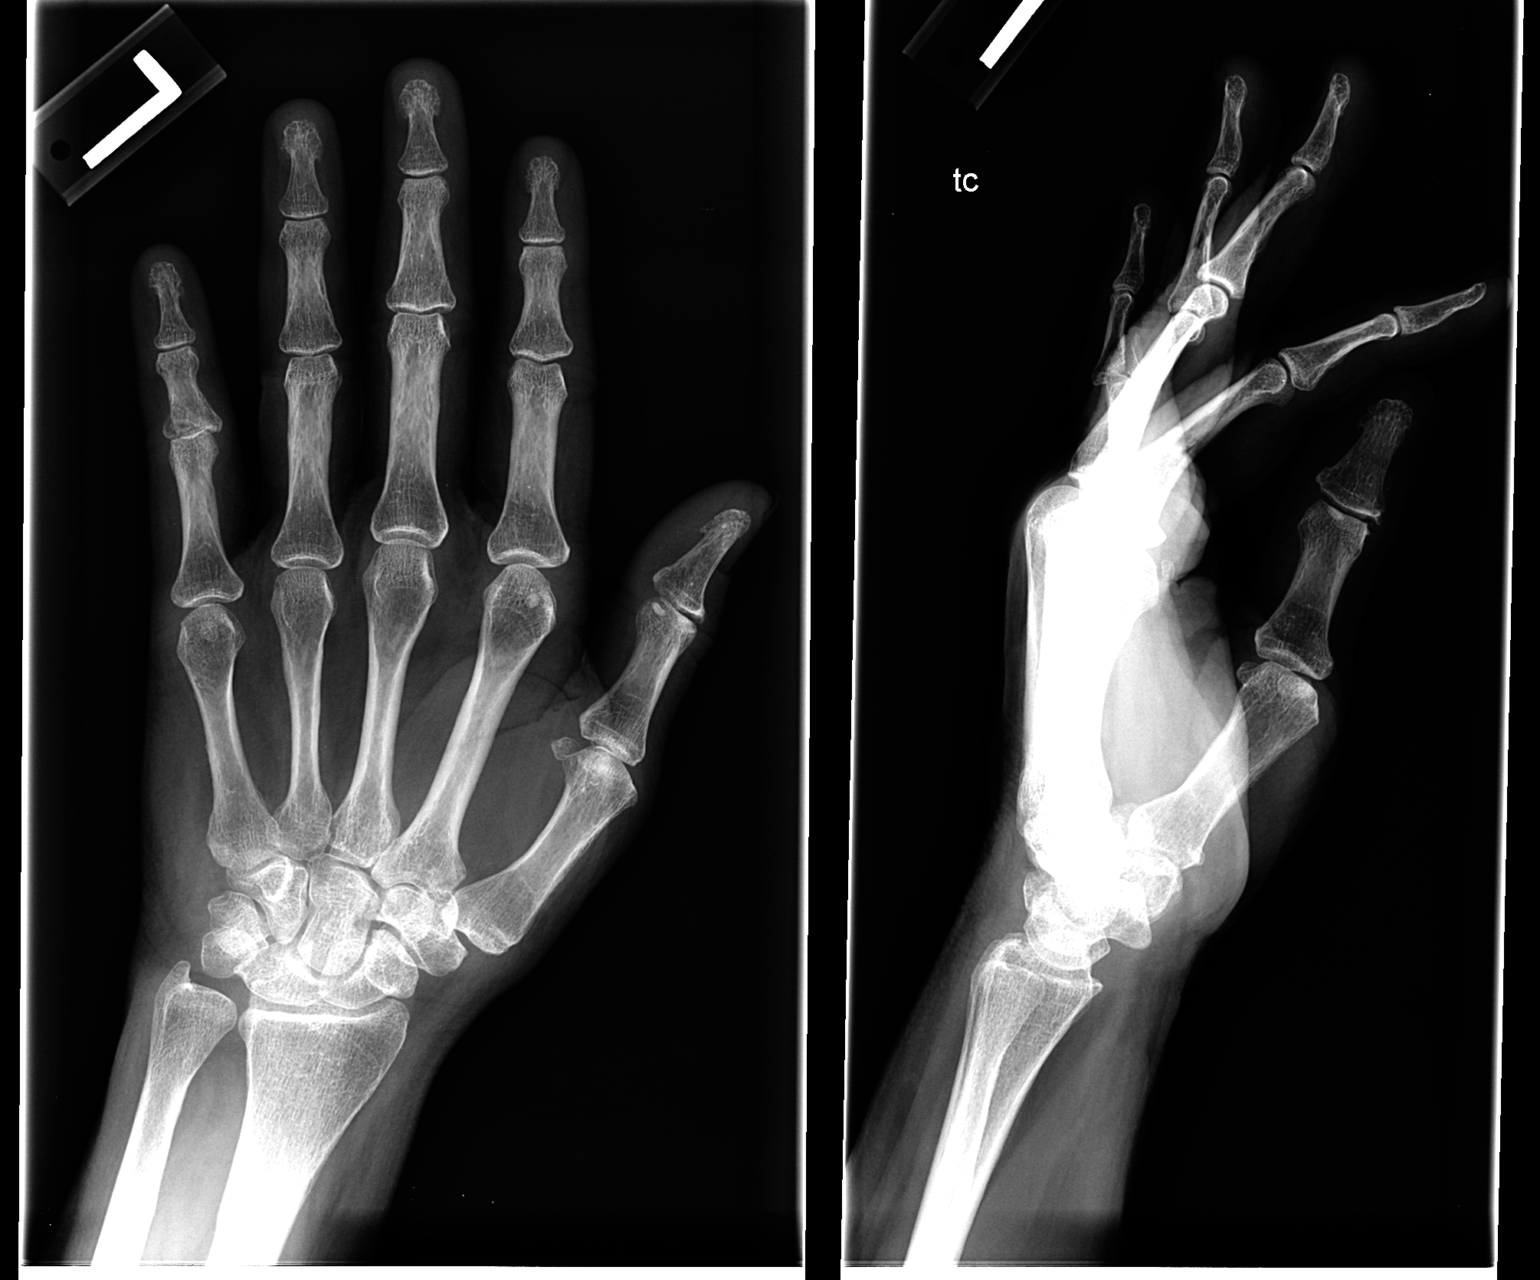

[1 of 1 positions shown; findings below may reference images not displayed]

FINDINGS: There is fracture through the base of the middle phalanx
of the left fifth finger which appears to extend to the left fifth
PIP joint.  There also on the lateral view appears to be partial
subluxation of the left fifth PIP joint.  The radiocarpal joint
space appears normal and the carpal bones are in normal position.
MCP and DIP joints are unremarkable.
IMPRESSION: Fracture of the base of the middle phalanx of the left fifth
finger with subluxation of the left fifth PIP joint.

## 2013-02-17 MED ORDER — SERTRALINE HCL 25 MG PO TABS: ORAL_TABLET | ORAL | Status: DC

## 2013-03-03 IMAGING — CR DG FINGER LITTLE 2+V*L*
1 series · 1 of 1 positions shown · non-contrast
Comparison: 10/17/2010.

CLINICAL DATA: History of fracture of the middle phalanx at the PIP
joint.  Follow-up.

LEFT LITTLE FINGER 2+V

[view not recorded]
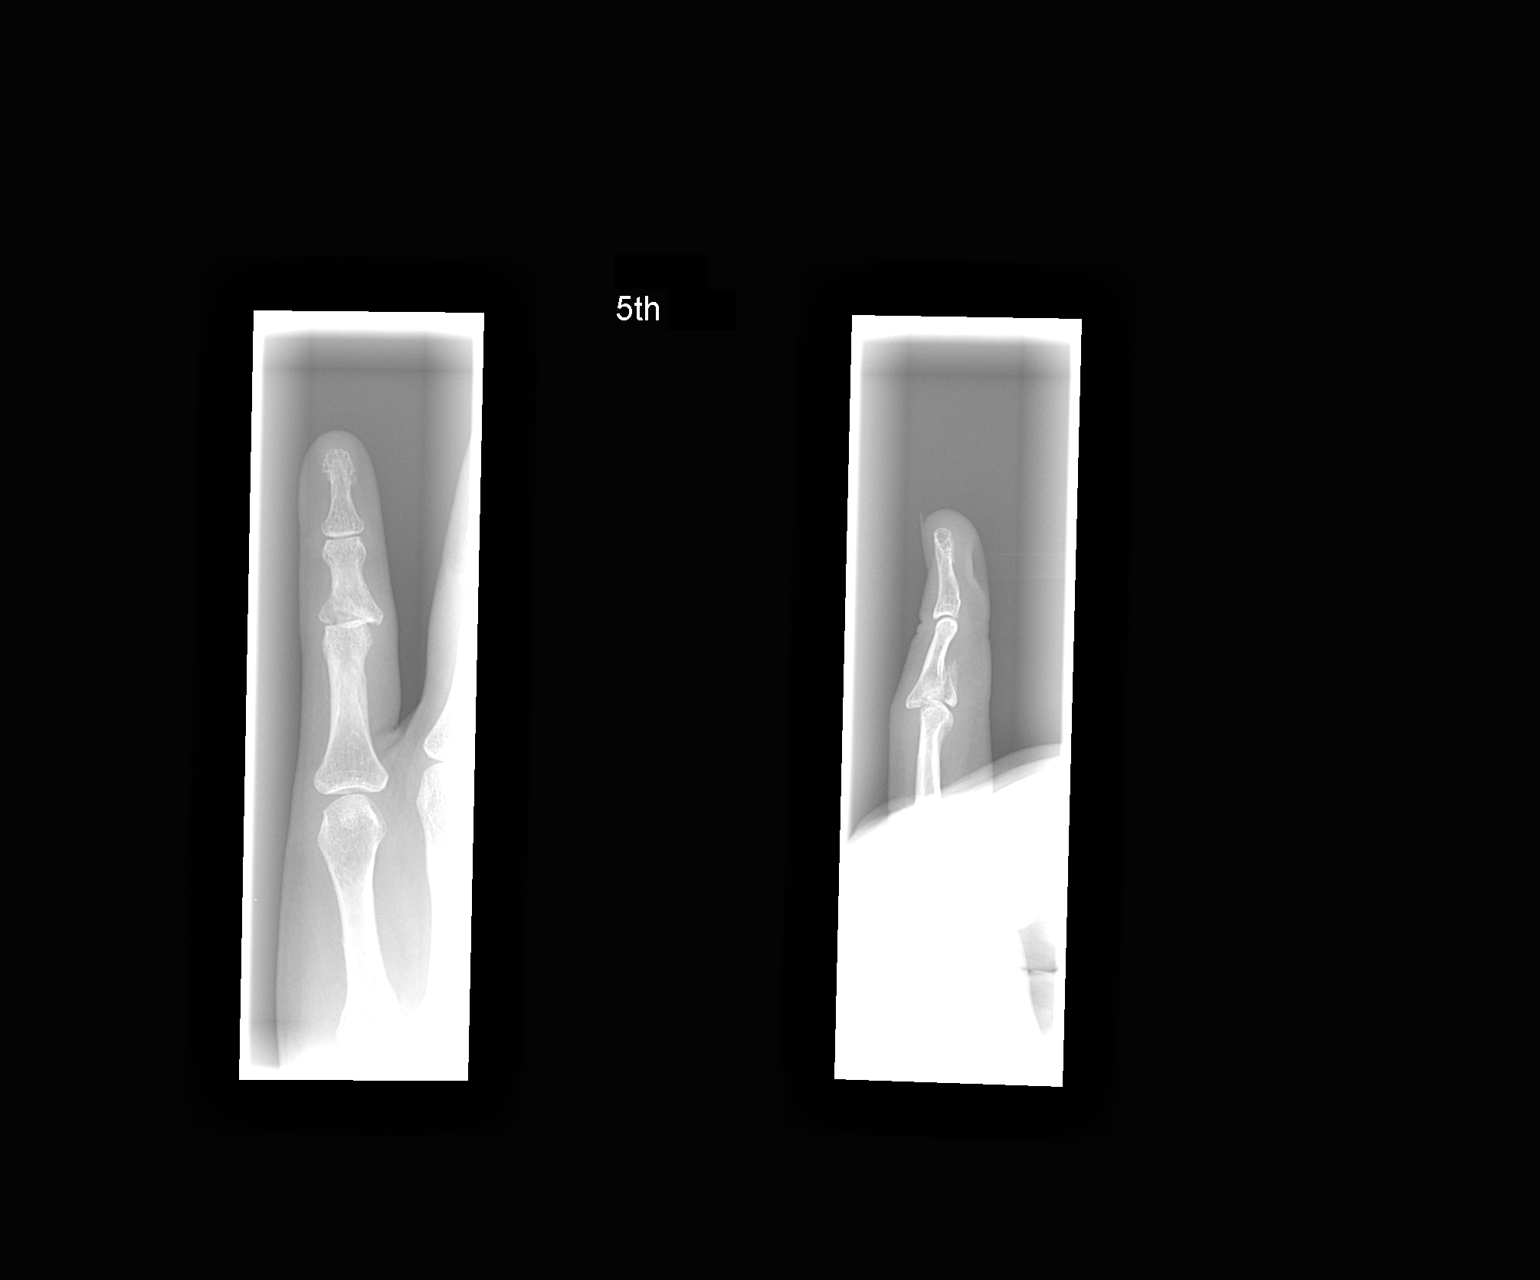

[1 of 1 positions shown; findings below may reference images not displayed]

FINDINGS: There is again fracture evident involving the proximal
aspect of the middle phalanx of the left fifth finger with
extension to involve the articular surface at the PIP joint.  There
is slight proximal and palmar displacement of the palmar fracture
fragment.  There is early callus formation.  No dislocation or
other fracture is seen.  There is stable slight dorsal subluxation
of the middle phalanx in relation to the proximal phalanx at the
PIP joint.
IMPRESSION: Fracture of theproximal aspect of the middle phalanx of the left
fifth finger with extension to involve the articular surface at the
PIP joint.  There is slight proximal and palmar displacement of the
palmar fracture fragment.  There is early callus formation. There
is stable slight dorsal subluxation of the middle phalanx in
relation to the proximal phalanx at the PIP joint.

## 2013-03-17 ENCOUNTER — Other Ambulatory Visit (HOSPITAL_COMMUNITY): Payer: Self-pay | Admitting: Psychiatry

## 2013-03-18 ENCOUNTER — Other Ambulatory Visit (HOSPITAL_COMMUNITY): Payer: Self-pay | Admitting: Psychiatry

## 2013-03-22 ENCOUNTER — Other Ambulatory Visit (HOSPITAL_COMMUNITY): Payer: Self-pay | Admitting: Psychiatry

## 2013-03-22 DIAGNOSIS — F411 Generalized anxiety disorder: Secondary | ICD-10-CM

## 2013-03-23 MED ORDER — LAMOTRIGINE 100 MG PO TABS: ORAL_TABLET | ORAL | Status: DC

## 2013-03-23 MED ORDER — SERTRALINE HCL 25 MG PO TABS: ORAL_TABLET | ORAL | Status: DC

## 2013-03-23 NOTE — Telephone Encounter (Signed)
Provided refill of medications.

## 2013-03-31 ENCOUNTER — Encounter (INDEPENDENT_AMBULATORY_CARE_PROVIDER_SITE_OTHER): Payer: Self-pay

## 2013-03-31 ENCOUNTER — Encounter (HOSPITAL_COMMUNITY): Payer: Self-pay | Admitting: Psychiatry

## 2013-03-31 ENCOUNTER — Ambulatory Visit (INDEPENDENT_AMBULATORY_CARE_PROVIDER_SITE_OTHER): Payer: BC Managed Care – PPO | Admitting: Psychiatry

## 2013-03-31 VITALS — BP 133/78 | HR 92 | Ht 65.0 in | Wt 162.0 lb

## 2013-03-31 DIAGNOSIS — F314 Bipolar disorder, current episode depressed, severe, without psychotic features: Secondary | ICD-10-CM

## 2013-03-31 DIAGNOSIS — F411 Generalized anxiety disorder: Secondary | ICD-10-CM

## 2013-03-31 MED ORDER — LAMOTRIGINE 100 MG PO TABS: ORAL_TABLET | ORAL | Status: DC

## 2013-03-31 MED ORDER — SERTRALINE HCL 50 MG PO TABS: ORAL_TABLET | ORAL | Status: DC

## 2013-03-31 MED ORDER — TRAZODONE HCL 50 MG PO TABS: ORAL_TABLET | ORAL | Status: DC
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
## 2013-04-02 DIAGNOSIS — F411 Generalized anxiety disorder: Secondary | ICD-10-CM | POA: Insufficient documentation

## 2013-04-19 ENCOUNTER — Ambulatory Visit (HOSPITAL_COMMUNITY): Payer: Self-pay | Admitting: Psychiatry

## 2013-05-10 ENCOUNTER — Ambulatory Visit (INDEPENDENT_AMBULATORY_CARE_PROVIDER_SITE_OTHER): Payer: BC Managed Care – PPO | Admitting: Psychiatry

## 2013-05-10 ENCOUNTER — Encounter (INDEPENDENT_AMBULATORY_CARE_PROVIDER_SITE_OTHER): Payer: Self-pay

## 2013-05-10 ENCOUNTER — Encounter (HOSPITAL_COMMUNITY): Payer: Self-pay | Admitting: Psychiatry

## 2013-05-10 VITALS — BP 133/90 | HR 95 | Wt 160.0 lb

## 2013-05-10 DIAGNOSIS — F411 Generalized anxiety disorder: Secondary | ICD-10-CM

## 2013-05-10 DIAGNOSIS — F314 Bipolar disorder, current episode depressed, severe, without psychotic features: Secondary | ICD-10-CM

## 2013-05-10 MED ORDER — TRAZODONE HCL 50 MG PO TABS: ORAL_TABLET | ORAL | Status: DC

## 2013-05-10 MED ORDER — LAMOTRIGINE 25 MG PO TABS: ORAL_TABLET | ORAL | Status: DC

## 2013-05-10 MED ORDER — SERTRALINE HCL 50 MG PO TABS: ORAL_TABLET | ORAL | Status: DC

## 2013-05-10 MED ORDER — LAMOTRIGINE 100 MG PO TABS: ORAL_TABLET | ORAL | Status: DC
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
## 2013-05-19 ENCOUNTER — Other Ambulatory Visit (HOSPITAL_COMMUNITY): Payer: Self-pay | Admitting: Psychiatry

## 2013-05-19 NOTE — Telephone Encounter (Signed)
Refilled sertraline at 50 mg.

## 2013-06-01 ENCOUNTER — Other Ambulatory Visit (HOSPITAL_COMMUNITY): Payer: Self-pay | Admitting: Psychiatry

## 2013-06-27 ENCOUNTER — Ambulatory Visit (HOSPITAL_COMMUNITY): Payer: Self-pay | Admitting: Psychiatry

## 2013-07-01 ENCOUNTER — Other Ambulatory Visit (HOSPITAL_COMMUNITY): Payer: Self-pay | Admitting: Psychiatry

## 2013-07-07 ENCOUNTER — Other Ambulatory Visit (HOSPITAL_COMMUNITY): Payer: Self-pay | Admitting: Psychiatry

## 2013-08-13 ENCOUNTER — Other Ambulatory Visit (HOSPITAL_COMMUNITY): Payer: Self-pay | Admitting: Psychiatry

## 2013-09-18 ENCOUNTER — Other Ambulatory Visit (HOSPITAL_COMMUNITY): Payer: Self-pay | Admitting: Psychiatry

## 2013-11-07 ENCOUNTER — Encounter (INDEPENDENT_AMBULATORY_CARE_PROVIDER_SITE_OTHER): Payer: Self-pay

## 2013-11-07 ENCOUNTER — Ambulatory Visit (INDEPENDENT_AMBULATORY_CARE_PROVIDER_SITE_OTHER): Payer: BC Managed Care – PPO | Admitting: Psychiatry

## 2013-11-07 ENCOUNTER — Encounter (HOSPITAL_COMMUNITY): Payer: Self-pay | Admitting: Psychiatry

## 2013-11-07 VITALS — BP 116/82 | HR 90 | Wt 153.0 lb

## 2013-11-07 DIAGNOSIS — F411 Generalized anxiety disorder: Secondary | ICD-10-CM

## 2013-11-07 DIAGNOSIS — F314 Bipolar disorder, current episode depressed, severe, without psychotic features: Secondary | ICD-10-CM

## 2013-11-07 MED ORDER — LAMOTRIGINE 150 MG PO TABS: ORAL_TABLET | ORAL | Status: DC

## 2013-11-07 MED ORDER — SERTRALINE HCL 50 MG PO TABS: ORAL_TABLET | ORAL | Status: DC
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
## 2013-11-22 ENCOUNTER — Telehealth (HOSPITAL_COMMUNITY): Payer: Self-pay | Admitting: Psychiatry

## 2013-11-22 DIAGNOSIS — F411 Generalized anxiety disorder: Secondary | ICD-10-CM

## 2013-11-22 MED ORDER — SERTRALINE HCL 50 MG PO TABS: ORAL_TABLET | ORAL | Status: DC

## 2013-11-22 NOTE — Telephone Encounter (Signed)
Prescription sent for zoloft 50mg  qd.

## 2014-01-09 ENCOUNTER — Ambulatory Visit (HOSPITAL_COMMUNITY): Payer: Self-pay | Admitting: Psychiatry

## 2014-01-10 ENCOUNTER — Encounter (INDEPENDENT_AMBULATORY_CARE_PROVIDER_SITE_OTHER): Payer: Self-pay

## 2014-01-10 ENCOUNTER — Ambulatory Visit (INDEPENDENT_AMBULATORY_CARE_PROVIDER_SITE_OTHER): Payer: BC Managed Care – PPO | Admitting: Psychiatry

## 2014-01-10 ENCOUNTER — Encounter (HOSPITAL_COMMUNITY): Payer: Self-pay | Admitting: Psychiatry

## 2014-01-10 VITALS — BP 142/81 | HR 92 | Ht 65.0 in | Wt 163.0 lb

## 2014-01-10 DIAGNOSIS — F411 Generalized anxiety disorder: Secondary | ICD-10-CM

## 2014-01-10 DIAGNOSIS — F314 Bipolar disorder, current episode depressed, severe, without psychotic features: Secondary | ICD-10-CM

## 2014-01-10 MED ORDER — LAMOTRIGINE 150 MG PO TABS: ORAL_TABLET | ORAL | Status: AC

## 2014-01-10 MED ORDER — SERTRALINE HCL 50 MG PO TABS: ORAL_TABLET | ORAL | Status: AC
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
## 2014-02-28 ENCOUNTER — Other Ambulatory Visit (HOSPITAL_COMMUNITY): Payer: Self-pay | Admitting: Psychiatry

## 2014-07-28 DEATH — deceased

## 2015-06-07 ENCOUNTER — Encounter: Payer: Self-pay | Admitting: Internal Medicine
